# Patient Record
Sex: Male | Born: 1975 | Race: White | Hispanic: No | Marital: Single | Smoking: Never smoker
Health system: Southern US, Community
[De-identification: ages and names within clinical notes are randomized; demographics above are authoritative.]

## PROBLEM LIST (undated history)

## (undated) DIAGNOSIS — Z8719 Personal history of other diseases of the digestive system: Secondary | ICD-10-CM

## (undated) DIAGNOSIS — F329 Major depressive disorder, single episode, unspecified: Secondary | ICD-10-CM

## (undated) DIAGNOSIS — F419 Anxiety disorder, unspecified: Secondary | ICD-10-CM

## (undated) DIAGNOSIS — Z9889 Other specified postprocedural states: Secondary | ICD-10-CM

## (undated) DIAGNOSIS — F32A Depression, unspecified: Secondary | ICD-10-CM

## (undated) HISTORY — PX: PARATHYROIDECTOMY: SHX19

## (undated) HISTORY — PX: HERNIA REPAIR: SHX51

---

## 2007-10-21 ENCOUNTER — Emergency Department (HOSPITAL_COMMUNITY): Admission: EM | Admit: 2007-10-21 | Discharge: 2007-10-21 | Payer: Self-pay | Admitting: Emergency Medicine

## 2007-12-11 ENCOUNTER — Encounter: Admission: RE | Admit: 2007-12-11 | Discharge: 2007-12-11 | Payer: Self-pay | Admitting: Internal Medicine

## 2008-02-11 ENCOUNTER — Ambulatory Visit (HOSPITAL_COMMUNITY): Admission: RE | Admit: 2008-02-11 | Discharge: 2008-02-11 | Payer: Self-pay | Admitting: General Surgery

## 2008-02-11 ENCOUNTER — Encounter (INDEPENDENT_AMBULATORY_CARE_PROVIDER_SITE_OTHER): Payer: Self-pay | Admitting: General Surgery

## 2009-07-08 ENCOUNTER — Emergency Department (HOSPITAL_COMMUNITY): Admission: EM | Admit: 2009-07-08 | Discharge: 2009-07-09 | Payer: Self-pay | Admitting: Emergency Medicine

## 2009-09-16 ENCOUNTER — Emergency Department (HOSPITAL_COMMUNITY): Admission: EM | Admit: 2009-09-16 | Discharge: 2009-09-16 | Payer: Self-pay | Admitting: Emergency Medicine

## 2010-08-08 LAB — URINALYSIS, ROUTINE W REFLEX MICROSCOPIC
Ketones, ur: NEGATIVE mg/dL
Leukocytes, UA: NEGATIVE
Protein, ur: NEGATIVE mg/dL

## 2010-08-08 LAB — POCT I-STAT, CHEM 8
BUN: 18 mg/dL (ref 6–23)
Calcium, Ion: 1.13 mmol/L (ref 1.12–1.32)
Chloride: 108 mEq/L (ref 96–112)
Creatinine, Ser: 0.8 mg/dL (ref 0.4–1.5)
Glucose, Bld: 94 mg/dL (ref 70–99)
HCT: 45 % (ref 39.0–52.0)
Hemoglobin: 15.3 g/dL (ref 13.0–17.0)
Potassium: 3.8 mEq/L (ref 3.5–5.1)
Sodium: 142 meq/L (ref 135–145)
TCO2: 25 mmol/L (ref 0–100)

## 2010-08-08 LAB — URINE MICROSCOPIC-ADD ON

## 2010-08-08 LAB — CALCIUM: Calcium: 9.5 mg/dL (ref 8.4–10.5)

## 2010-10-03 NOTE — Op Note (Signed)
NAME:  Dennis Bush, Dennis Bush NO.:  [id number]   MEDICAL RECORD NO.:  [id number]          PATIENT TYPE:  AMB   LOCATION:  DAY                          FACILITY:  [facility]   PHYSICIAN:  Lennie Muckle, MD      DATE OF BIRTH:  [birth year]   DATE OF PROCEDURE:  02/11/2008  DATE OF DISCHARGE:                               OPERATIVE REPORT   PREOPERATIVE DIAGNOSES:  Right parathyroid adenoma.   POSTOPERATIVE DIAGNOSES:  Right parathyroid adenoma.   PROCEDURE:  Right parathyroid removal.   SURGEON:  Lennie Muckle, M.D.   ASSISTANT:  Leonie Man, M.D.   ANESTHESIA:  General endotracheal anesthesia.   FINDINGS:  A 1 g parathyroid adenoma or parathyroid gland.   COMPLICATIONS:  No immediate complications.   DRAINS:  No drains were placed.   INDICATIONS FOR PROCEDURE:  Dennis Bush is a 35 year old male who had  been having years of joint and body aches, headaches, etc.  During a  workup for headache, calcium level elevated to 11.1, PTH was elevated at  95.  Sestamibi scan revealed localization to the right inferior lobe of  the thyroid.  His examination and findings seemed consistent with a  right parathyroid adenoma.  I discussed with him preoperatively  performing a resection of the adenoma.  Risks of the surgery were  discussed with the patient prior to the procedure.  Informed consent was  obtained.   DETAILS OF PROCEDURE:  Dennis Bush was identified in the preoperative  holding area.  I had marked the right side preoperatively.  He had  received a gram of Kefzol and was taken to the operating room.  Once in  the operating room, he was placed in a supine position.  After  administration of general endotracheal anesthesia, he was placed in a  chair position.  His anterior neck was prepped and draped in the usual  sterile fashion.  Procedure time-out and identification of patient and  procedure were performed.  Using anatomic landmarks of the sternal notch  and  palpation of the cricothyroid, I measured an incision two  fingerbreadths above the sternal notch.  I placed a 3 cm incision.  Subcutaneous tissues were divided with electrocautery.  The platysma and  muscle was divided with electrocautery.  Superior and inferior straps  with electrocautery and blunt dissection.  The strap muscle on the right  was retracted laterally and, using electrocautery, dissected the right  plane.  The thyroid gland was identified.  Finger palpation revealed a  somewhat nodular goiter on the right.  Using careful dissection, we were  able to visualize a lesion just near the right inferior thyroid gland.  This did look suspicious for the adenoma.  Carefully dissecting down to  the adenoma, we placed clips on the small feeding vessels.  I did clip  and ligate the inferior thyroid vessel.  After fully removing the  parathyroid gland, this was passed to pathology for frozen section.  It  was identified as a 1 g parathyroid gland, consistent with adenoma.  No  suspicious cells were seen.   The wound  bed was irrigated.  Small vessel inferiorly was clipped.  Upon  final inspection, there was no evidence of bleeding.  I did place  Surgicel within the wound bed.  The strap muscles were reapproximated.  Platysma was closed with 3-0 Vicryl in interrupted fashion.  Skin was  closed with 4-0 Monocryl.  Steri-Strips were placed, followed by a  dressing.  I then used 0.25% Marcaine with epinephrine to anesthetize  the skin.  Patient was awakened, extubated, and transported to the  postanesthesia care unit in stable condition.   Due to his preoperative calcium being, I will place him on two Tums  q.i.d. starting immediately postoperatively.  Have him check a calcium  level tomorrow, and then he will have follow up with me in approximately  two weeks' time.      Lennie Muckle, MD  Electronically Signed     ALA/MEDQ  D:  02/11/2008  T:  02/11/2008  Job:  [id number]   cc:    Harrel Lemon. Merla Riches, M.D.  Fax: [phone]

## 2011-02-15 LAB — URINALYSIS, ROUTINE W REFLEX MICROSCOPIC
Glucose, UA: NEGATIVE
Specific Gravity, Urine: 1.028
Urobilinogen, UA: 1
pH: 6.5

## 2011-02-15 LAB — URINE MICROSCOPIC-ADD ON

## 2011-02-19 LAB — BASIC METABOLIC PANEL
BUN: 15
CO2: 30
GFR calc Af Amer: >60
GFR calc non Af Amer: >60

## 2011-02-19 LAB — HEMOGLOBIN AND HEMATOCRIT, BLOOD
HCT: 44.7
Hemoglobin: 15.6

## 2012-03-05 IMAGING — CT CT ABD-PELV W/O CM
2 of 4 series · 17 of 46 positions shown, 19 images · non-contrast
Comparison: Prior examinations 10/21/2007.

CLINICAL DATA: Right flank pain and hematuria.

CT ABDOMEN AND PELVIS WITHOUT CONTRAST
TECHNIQUE: Multidetector CT imaging of the abdomen and pelvis was
performed following the standard protocol without intravenous
contrast.

[Series 2: stone <(id) >(id) · axial · 0.70mm/px · z∈[-511,-131]mm · 14 of 84 slices shown, 16 images]
[im 4/84  soft-tissue]
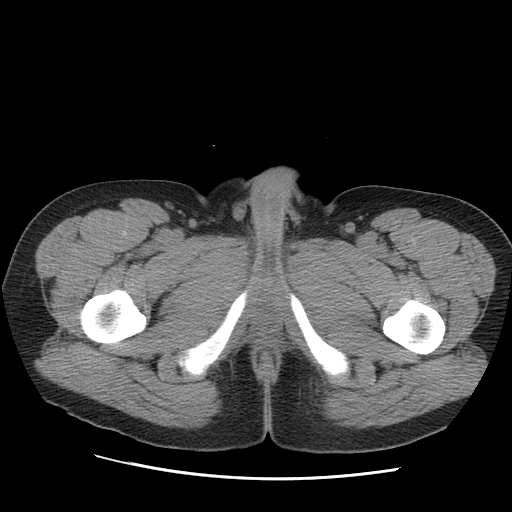
[im 4/84  bone]
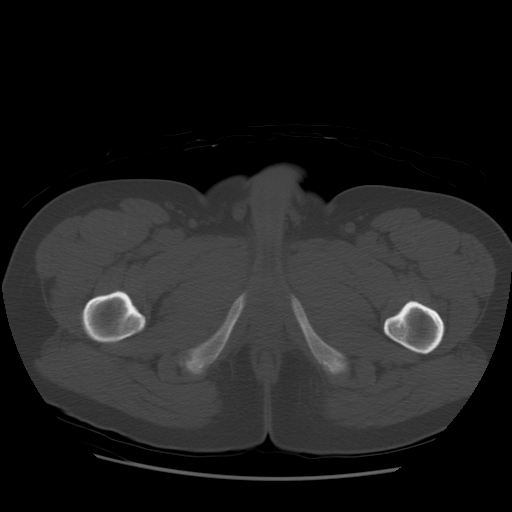
[im 11/84  soft-tissue]
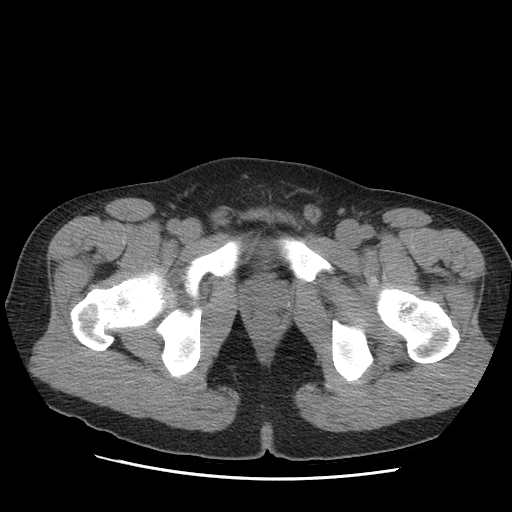
[im 18/84  soft-tissue]
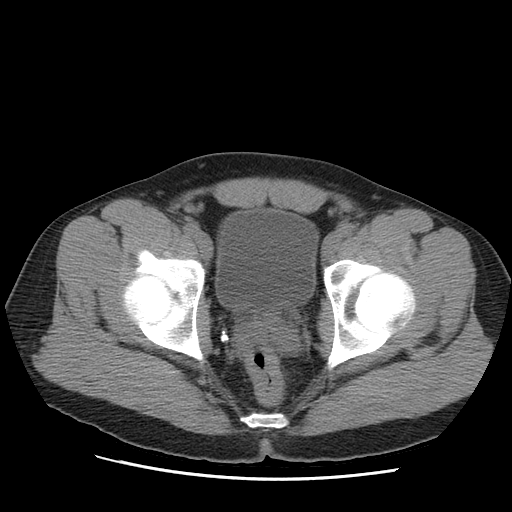
[im 21/84  soft-tissue]
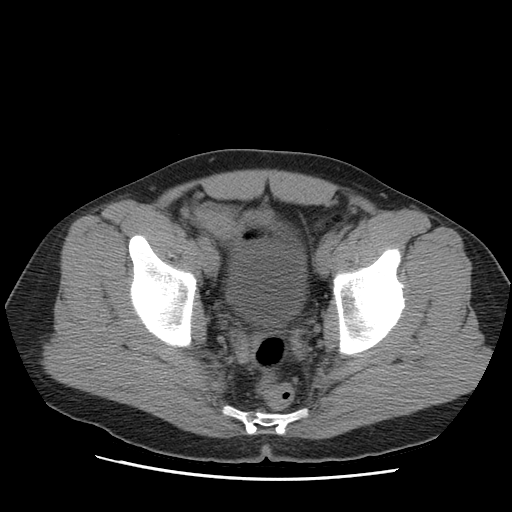
[im 28/84  soft-tissue]
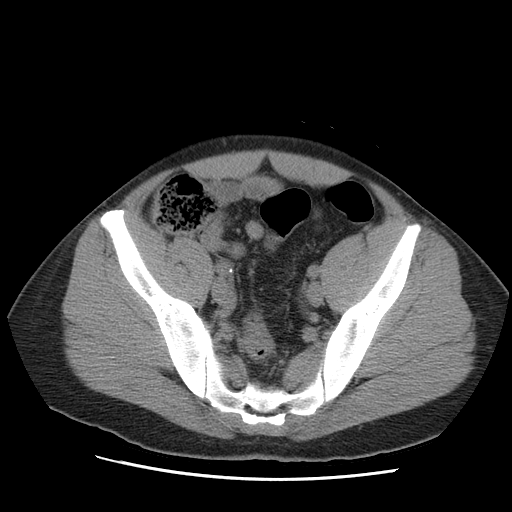
[im 35/84  soft-tissue]
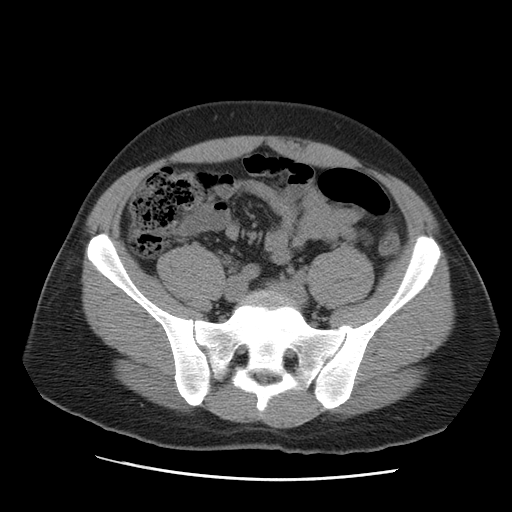
[im 39/84  soft-tissue]
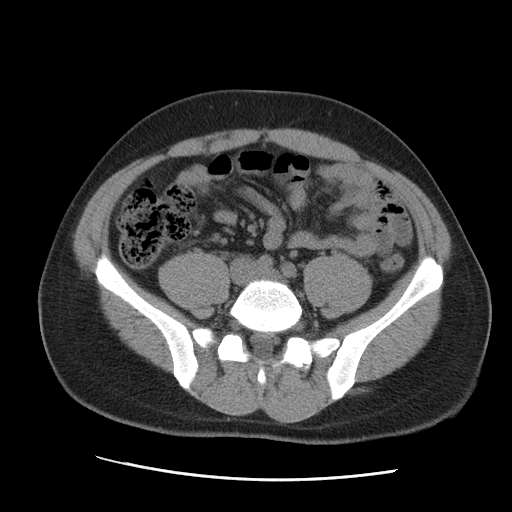
[im 45/84  soft-tissue]
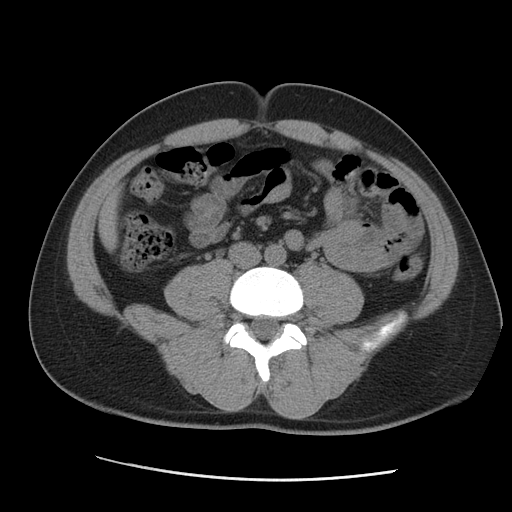
[im 49/84  soft-tissue]
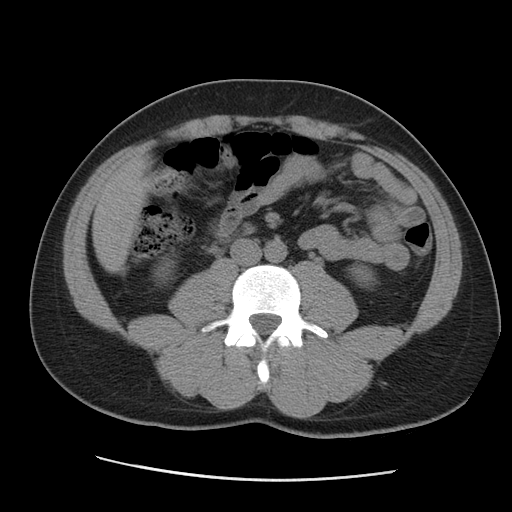
[im 49/84  bone]
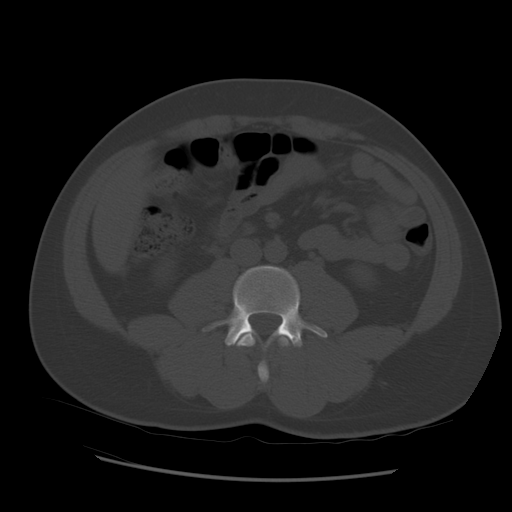
[im 56/84  soft-tissue]
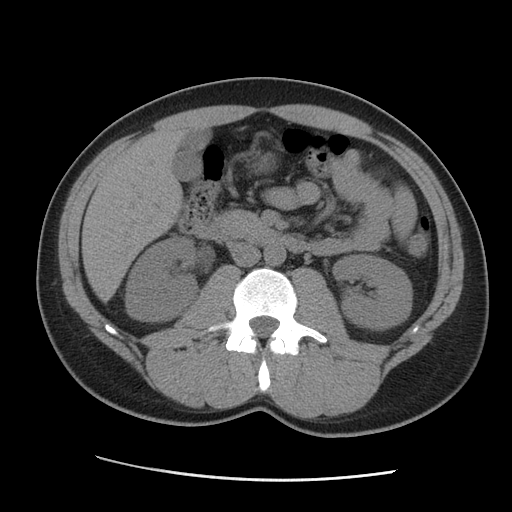
[im 63/84  soft-tissue]
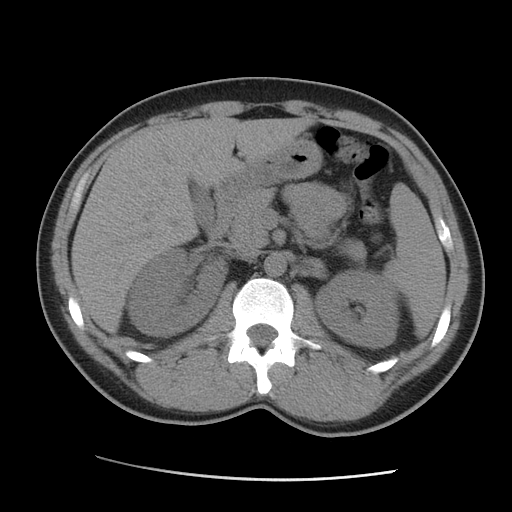
[im 66/84  soft-tissue]
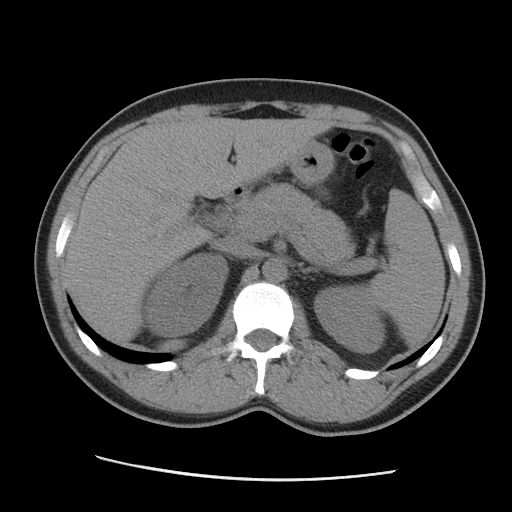
[im 73/84  soft-tissue]
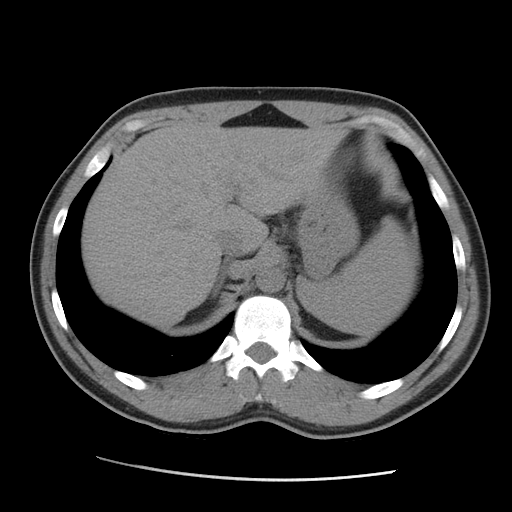
[im 80/84  soft-tissue]
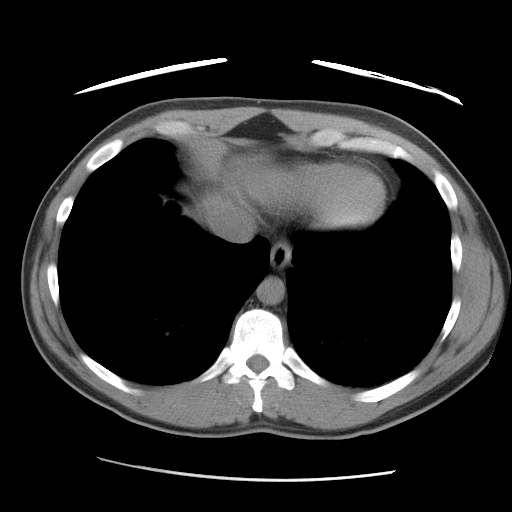

[Series 401: cor · coronal · 0.83mm/px · 3 of 81 slices shown]
[im 27/81  soft-tissue]
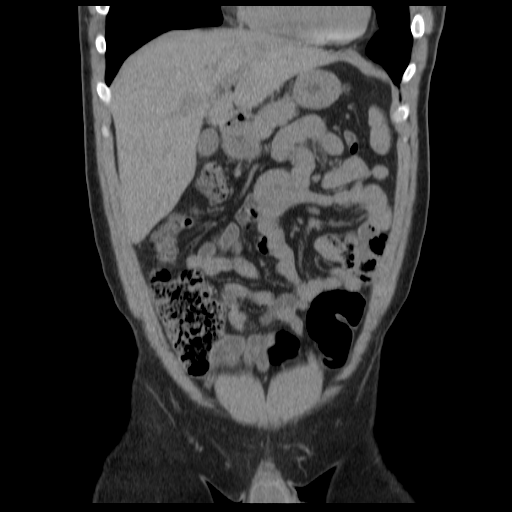
[im 36/81  soft-tissue]
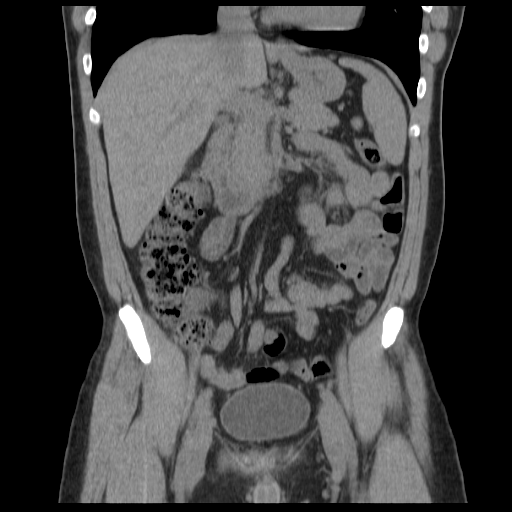
[im 45/81  soft-tissue]
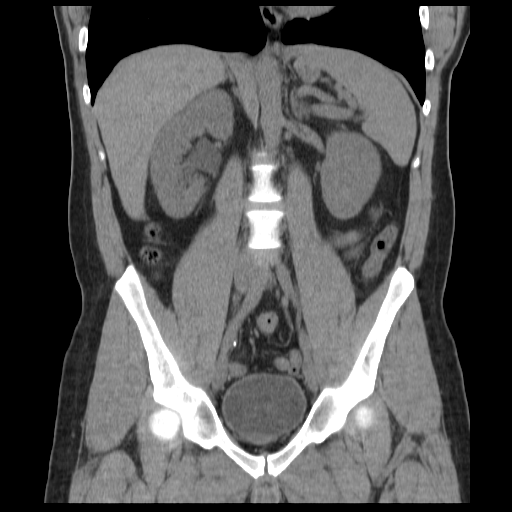

[17 of 46 positions shown; findings below may reference images not displayed]

FINDINGS: There is new mild right-sided hydronephrosis and
hydroureter associated with mild perinephric soft tissue stranding.
No renal calculi are demonstrated.  The right ureter is dilated
into the pelvis where there is a new 3 mm calcification on image
57, probably representing a ureteral calculus.  This is located 6
cm above the ureteral vesicle junction.  The distal right ureter
appears decompressed.  There are bilateral pelvic phleboliths.

The lung bases are clear. The remainder of the visualized abdomen
appears unremarkable as imaged in the noncontrast state, but as
such is suboptimally evaluated.
IMPRESSION: 1.  Obstructing 3 mm calculus in the right ureter approximately 6
cm above the ureteral vesicle junction.
2.  No renal calculi.
3.  No other acute abdominal findings.

## 2013-08-19 ENCOUNTER — Encounter (HOSPITAL_COMMUNITY): Payer: Self-pay | Admitting: Emergency Medicine

## 2013-08-19 ENCOUNTER — Emergency Department (HOSPITAL_COMMUNITY)
Admission: EM | Admit: 2013-08-19 | Discharge: 2013-08-19 | Disposition: A | Discharge status: Home / Self Care | Payer: Self-pay | Attending: Emergency Medicine | Admitting: Emergency Medicine

## 2013-08-19 DIAGNOSIS — R590 Localized enlarged lymph nodes: Secondary | ICD-10-CM

## 2013-08-19 DIAGNOSIS — Z79899 Other long term (current) drug therapy: Secondary | ICD-10-CM | POA: Insufficient documentation

## 2013-08-19 DIAGNOSIS — R599 Enlarged lymph nodes, unspecified: Secondary | ICD-10-CM | POA: Insufficient documentation

## 2013-08-19 DIAGNOSIS — F411 Generalized anxiety disorder: Secondary | ICD-10-CM | POA: Insufficient documentation

## 2013-08-19 DIAGNOSIS — F329 Major depressive disorder, single episode, unspecified: Secondary | ICD-10-CM | POA: Insufficient documentation

## 2013-08-19 DIAGNOSIS — Z9889 Other specified postprocedural states: Secondary | ICD-10-CM | POA: Insufficient documentation

## 2013-08-19 DIAGNOSIS — F3289 Other specified depressive episodes: Secondary | ICD-10-CM | POA: Insufficient documentation

## 2013-08-19 HISTORY — DX: Major depressive disorder, single episode, unspecified: F32.9

## 2013-08-19 HISTORY — DX: Depression, unspecified: F32.A

## 2013-08-19 HISTORY — DX: Other specified postprocedural states: Z98.890

## 2013-08-19 HISTORY — DX: Anxiety disorder, unspecified: F41.9

## 2013-08-19 HISTORY — DX: Personal history of other diseases of the digestive system: Z87.19

## 2013-08-19 LAB — RPR: RPR: NONREACTIVE

## 2013-08-19 NOTE — Discharge Instructions (Signed)
Lymphadenopathy °Lymphadenopathy means "disease of the lymph glands." But the term is usually used to describe swollen or enlarged lymph glands, also called lymph nodes. These are the bean-shaped organs found in many locations including the neck, underarm, and groin. Lymph glands are part of the immune system, which fights infections in your body. Lymphadenopathy can occur in just one area of the body, such as the neck, or it can be generalized, with lymph node enlargement in several areas. The nodes found in the neck are the most common sites of lymphadenopathy. °CAUSES  °When your immune system responds to germs (such as viruses or bacteria ), infection-fighting cells and fluid build up. This causes the glands to grow in size. This is usually not something to worry about. Sometimes, the glands themselves can become infected and inflamed. This is called lymphadenitis. °Enlarged lymph nodes can be caused by many diseases: °· Bacterial disease, such as strep throat or a skin infection. °· Viral disease, such as a common cold. °· Other germs, such as lyme disease, tuberculosis, or sexually transmitted diseases. °· Cancers, such as lymphoma (cancer of the lymphatic system) or leukemia (cancer of the white blood cells). °· Inflammatory diseases such as lupus or rheumatoid arthritis. °· Reactions to medications. °Many of the diseases above are rare, but important. This is why you should see your caregiver if you have lymphadenopathy. °SYMPTOMS  °· Swollen, enlarged lumps in the neck, back of the head or other locations. °· Tenderness. °· Warmth or redness of the skin over the lymph nodes. °· Fever. °DIAGNOSIS  °Enlarged lymph nodes are often near the source of infection. They can help healthcare providers diagnose your illness. For instance:  °· Swollen lymph nodes around the jaw might be caused by an infection in the mouth. °· Enlarged glands in the neck often signal a throat infection. °· Lymph nodes that are swollen  in more than one area often indicate an illness caused by a virus. °Your caregiver most likely will know what is causing your lymphadenopathy after listening to your history and examining you. Blood tests, x-rays or other tests may be needed. If the cause of the enlarged lymph node cannot be found, and it does not go away by itself, then a biopsy may be needed. Your caregiver will discuss this with you. °TREATMENT  °Treatment for your enlarged lymph nodes will depend on the cause. Many times the nodes will shrink to normal size by themselves, with no treatment. Antibiotics or other medicines may be needed for infection. Only take over-the-counter or prescription medicines for pain, discomfort or fever as directed by your caregiver. °HOME CARE INSTRUCTIONS  °Swollen lymph glands usually return to normal when the underlying medical condition goes away. If they persist, contact your health-care provider. He/she might prescribe antibiotics or other treatments, depending on the diagnosis. Take any medications exactly as prescribed. Keep any follow-up appointments made to check on the condition of your enlarged nodes.  °SEEK MEDICAL CARE IF:  °· Swelling lasts for more than two weeks. °· You have symptoms such as weight loss, night sweats, fatigue or fever that does not go away. °· The lymph nodes are hard, seem fixed to the skin or are growing rapidly. °· Skin over the lymph nodes is red and inflamed. This could mean there is an infection. °SEEK IMMEDIATE MEDICAL CARE IF:  °· Fluid starts leaking from the area of the enlarged lymph node. °· You develop a fever of 102° F (38.9° C) or greater. °· Severe   pain develops (not necessarily at the site of a large lymph node).  You develop chest pain or shortness of breath.  You develop worsening abdominal pain. MAKE SURE YOU:   Understand these instructions.  Will watch your condition.  Will get help right away if you are not doing well or get worse. Document  Released: 02/14/2008 Document Revised: 07/30/2011 Document Reviewed: 02/14/2008 [facility] Patient Information 2014 [place].   Emergency Department Resource Guide 1) Find a Doctor and Pay Out of Pocket Although you won't have to find out who is covered by your insurance plan, it is a good idea to ask around and get recommendations. You will then need to call the office and see if the doctor you have chosen will accept you as a new patient and what types of options they offer for patients who are self-pay. Some doctors offer discounts or will set up payment plans for their patients who do not have insurance, but you will need to ask so you aren't surprised when you get to your appointment.  2) Contact Your Local Health Department Not all health departments have doctors that can see patients for sick visits, but many do, so it is worth a call to see if yours does. If you don't know where your local health department is, you can check in your phone book. The CDC also has a tool to help you locate your state's health department, and many state websites also have listings of all of their local health departments.  3) Find a Walk-in Clinic If your illness is not likely to be very severe or complicated, you may want to try a walk in clinic. These are popping up all over the country in pharmacies, drugstores, and shopping centers. They're usually staffed by nurse practitioners or physician assistants that have been trained to treat common illnesses and complaints. They're usually fairly quick and inexpensive. However, if you have serious medical issues or chronic medical problems, these are probably not your best option.  No Primary Care Doctor: - Call Health Connect at  [phone] - they can help you locate a primary care doctor that  accepts your insurance, provides certain services, etc. - Physician Referral Service- [phone]  Chronic Pain Problems: Organization         Address  Phone    Notes  [facility]  [phone] Patients need to be referred by their primary care doctor.   Medication Assistance: Organization         Address  Phone   Notes  [facility]ication [facility] [address] [place], [place] [address] [phone] --Must be a resident of [facility] [place] -- Must have NO insurance coverage whatsoever (no Medicaid/ Medicare, etc.) -- The pt. MUST have a primary care doctor that directs their care regularly and follows them in the community   MedAssist  [phone]   [employer]  [phone]    Agencies that provide inexpensive medical care: Organization         Address  Phone   Notes  [facility]  [phone]   [facility]    [phone]   [facility] [address] [place], [place] [address] [phone]   [facility] [address], [place] [phone]   [facility]    [phone]   [facility]    [phone]) [phone]   [facility]  [address], [place] Phone:  [phone], Fax:  [phone] Hours of Operation:  9 am - 6 pm, M-F.  Also accepts Medicaid/Medicare and self-pay.  [facility]  [address], [place] Phone: [phone], Fax: [phone]. Hours of Operation:  8:30 am - 5:30 pm, M-F.  Also accepts Medicaid and self-pay.  [facility] [place] [address], [place] Phone: [phone]   [facility] [address] Natasha Bence [place], [place] [phone], Ext. [phone] Mondays & Thursdays: 7-9 AM.  First 15 patients are seen on a first come, first serve basis.    Medicaid-accepting [place] Providers:  Organization         Address  Phone   Notes  [facility] [address],  [phone] Also accepts self-pay patients.  [facility]  [address] Laurell Josephs [place], [place]  [phone]   [facility] [address], [place] [phone]   [facility] [address] [place] [phone]   Renaye Rakers [address], [place]   [phone] Only accepts [place] Access [place] patients after they have their name applied to their card.   Self-Pay (no insurance) in [facility]:  Organization         Address  Phone   Notes  Sickle Cell Patients, [facility] [address] [place], [place] [phone]   [facility] [address] [place], [place] [phone]   [facility]  [address], [place] [phone]   [facility]/Dr. Osei-Bonsu  [address] [place] or [address], [place] [phone] Phone number for both [place] and [place] locations is the same.  [facility] [address] [place] [phone]   [facility] [address] [place] or [address] [phone]   [facility] [address] [place] [phone], phone; [phone], fax Sees patients 1st and 3rd Saturday of every month.  Must not qualify for public or private insurance (i.e. Medicaid, Medicare, [place] Health Choice, Veterans' Benefits)  Household income should be no more than 200% of the poverty level The clinic cannot treat you if you are pregnant or think you are pregnant  Sexually transmitted diseases are not treated at the clinic.    Dental Care: Organization         Address  Phone  Notes  [facility] [address] [place], [place] [phone] Accepts children up to age 65 who are enrolled in [place] or [place] Health Choice; pregnant women with a Medicaid card; and children who have  applied for Medicaid or [place] Health Choice, but were declined, whose parents can pay a reduced fee at time of service.  [facility] Department of [facility]  [address], [place] [phone] Accepts children up to age 1 who are enrolled in [place] or [place] Health Choice; pregnant women with a Medicaid card; and children who have applied for Medicaid or [place] Health Choice, but were declined, whose parents can pay a reduced fee at time of service.  [facility]  [address] [place],  [place] [phone] Patients are seen by appointment only. Walk-ins are not accepted. [facility] will see patients 42 years of age and older. Monday - Tuesday (8am-5pm) Most Wednesdays (8:30-5pm) $30 per visit, cash only  [facility]  [address], [facility] [phone] Patients are seen by appointment only. Walk-ins are not accepted. [facility] will see patients 29 years of age and older. One Wednesday Evening (Monthly: Volunteer Based).  $30 per visit, cash only  [employer]  [phone] for adults; Children under age 97, call Graduate Pediatric Dentistry at [phone]. Children aged 30-14, please call [phone] to request a pediatric application.  Dental services are provided in all areas of dental care including fillings, crowns and bridges, complete and partial dentures, implants, gum treatment, root canals, and extractions. Preventive care is also provided. Treatment is provided to both adults and children. Patients are selected via a lottery and there is often a waiting list.   [facility] [address], [place]  [phone] [email or web address]   [facility] [address] [place], [place] [phone], Ext. [phone] Second and Fourth Thursday of each month, opens at 6:30 AM; Clinic ends at 9 AM.  Patients are seen on a first-come first-served basis, and a  limited number are seen during each clinic.   [facility]  [address] Ether Griffins [place], [place] [phone]   Eligibility Requirements You must have lived in [place], [place], or [place] counties for at least the last three months.   You cannot be eligible for state or federal sponsored [place], including [employer], [place], or [employer].   You generally cannot be eligible for healthcare insurance through your employer.    How to apply: Eligibility screenings are held every Tuesday and Wednesday afternoon from 1:00 pm until 4:00 pm. You do not need an appointment for the interview!  [facility] [address], [place], [place] [phone]   [facility] Health Department  [phone]   [facility] Health Department  [phone]   [facility] Health Department  [phone]    Behavioral Health Resources in the Community: Intensive Outpatient Programs Organization         Address  Phone  Notes  [facility] [address], [place], [place] [phone]   [facility] Out[facility] [address] [place], [place] [phone]   [facility] [address] [place], [place]  [phone]   [facility] [address]  [place], [place] [phone] or [phone]   Substance Abuse Resources Organization         Address  Phone  Notes  [facility]  [phone]   [facility]  [phone]   [facility]  [phone]   Floydene Flock  [phone]   Residential & Outpatient Substance Abuse Program  [phone]   Psychological Services Organization         Address  Phone  Notes  [facility]  [phone]   [facility]  [phone]   [facility] [address] [place] [phone] or [phone]    Mobile Crisis Teams Organization          Address  Phone  Notes  [facility], Mobile Crisis Care Unit  [phone]   [facility]  [address] [place], [place] [phone]   Doristine Locks [phone]  [address] [place] [phone]    Self-Help/Support Groups Organization         Address  Phone             Notes  [facility] of [place] - variety of support groups  336- I7437963 Call for more information  Narcotics Anonymous (NA), [facility] [address], [employer] [place]  2 meetings at this location   Statistician         Address  Phone  Notes  [facility] [address],    [place]  [phone]   [facility]  [address] [place] [address], [place], [place] [phone]   [facility] [address] [place], [place] [phone] Admissions: 8am-3pm M-F  [facility] [address]    [place], [place] [phone]   [facility] [address] [place], [place], [place] [phone]   [facility] [address],  [place], [place] [phone]   [facility] - Intensive Outpatient [address] Laurell Joseph[address], [place], [place] [phone]   [facility] ([facility] [address] [place]  [place], [place] [phone] or [phone]   [facility] ([facility]) [address], [place], [place] [phone] Accepts Medicaid  [facility] [address],  [place] [phone] Substance Abuse/Addiction Treatment   [facility] Organization         Address  Phone  Notes  [facility]  [phone]   Angie Fava, PhD [address] Ervin Knack [place], [place]   [phone] or [phone]   [facility]   [address] [place], [place] [phone]   [facility] [address], [place], [place] [phone] Insurance/Medicaid/sponsorship  through [facility] [address]                                    [place], [place] [phone] Therapy/tele-psych/case  [facility] [address], [place] [phone]    Dr. Lolly Mustache  [phone]   [facility]  [employer] [facility] Dept. 1) [address], [place] 2) [address], [place] 3)  [address], [place] [phone]  [phone]   [facility] Child Abuse Hotline 701-874-5018 or [phone] (After Hours)

## 2013-08-19 NOTE — ED Notes (Signed)
Pt alert x4 respirations easy non labored.  

## 2013-08-19 NOTE — ED Provider Notes (Signed)
CSN: [id number]     Arrival date & time 08/19/13  1316 History   First MD Initiated Contact with Patient 08/19/13 1524     Chief Complaint  Patient presents with  . Groin Pain  . Testicle Pain     (Consider location/radiation/quality/duration/timing/severity/associated sxs/prior Treatment) HPI Comments: 38 yo male with swelling in his groin for past few days.  Became painful while at work today.  No fevers.  No abd pain.    Patient is a 38 y.o. male presenting with groin pain.  Groin Pain This is a new problem. Episode onset: a few days ago, worse today. The problem occurs constantly. The problem has been gradually worsening. Pertinent negatives include no abdominal pain. 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 The symptoms are relieved by rest. He has tried nothing for the symptoms.    Past Medical History  Diagnosis Date  . Depression   . Anxiety   . H/O hernia repair    Past Surgical History  Procedure Laterality Date  . Hernia repair    . Parathyroidectomy     No family history on file. History  Substance Use Topics  . Smoking status: Never Smoker   . Smokeless tobacco: Not on file  . Alcohol Use: No    Review of Systems  Constitutional: Negative for fever.  Gastrointestinal: Negative for nausea, vomiting, abdominal pain and diarrhea.  Genitourinary: Negative for discharge, penile swelling, penile pain and testicular pain.  All other systems reviewed and are negative.      Allergies  Review of patient's allergies indicates no known allergies.  Home Medications   Current Outpatient Rx  Name  Route  Sig  Dispense  Refill  . ALPRAZolam (XANAX) 1 MG tablet   Oral   Take 1 mg by mouth 3 (three) times daily as needed for anxiety.         . Multiple Vitamins-Minerals (MULTIVITAMIN WITH MINERALS) tablet   Oral   Take 1 tablet by mouth daily.         . traMADol (ULTRAM) 50 MG tablet   Oral   Take 50 mg by mouth every 6 (six)  hours as needed for moderate pain.          BP 137/99  Pulse 88  Temp(Src) 97.7 F (36.5 C)  Resp 20  Ht 5\' 6"  (1.676 m)  Wt 135 lb (61.236 kg)  BMI 21.80 kg/m2  SpO2 96% Physical Exam  Nursing note and vitals reviewed. Constitutional: He is oriented to person, place, and time. He appears well-developed and well-nourished. No distress.  HENT:  Head: Normocephalic and atraumatic.  Eyes: Conjunctivae are normal. No scleral icterus.  Neck: Neck supple.  Cardiovascular: Normal rate and intact distal pulses.   Pulmonary/Chest: Effort normal. No stridor. No respiratory distress.  Abdominal: Normal appearance. He exhibits no distension.  Genitourinary:     Neurological: He is alert and oriented to person, place, and time.  Skin: Skin is warm and dry. No rash noted.  Psychiatric: He has a normal mood and affect. His behavior is normal.    ED Course  Procedures (including critical care time) Labs Review Labs Reviewed - No data to display Imaging Review No results found.   EKG Interpretation None      MDM   Final diagnoses:  Lymphadenopathy, inguinal    38 yo male with what appears to be a small tender lymph node in his right groin.  Does not feel like a  hernia.  No testicular pain or swelling.  No penile discharge or sores.  He desires STD testing, but denies symptoms.  GC/Chlamydia, RPR, and HIV drawn.    Candyce Churn III, MD 08/19/13 [phone]

## 2013-08-19 NOTE — ED Notes (Signed)
Pt states a couple days ago he noticed a lump to his R groin area with some pain radiating to his R testicle.  Pt rates pain 5/10.  Pt with hx of hernia and surgical repair.

## 2013-08-20 LAB — HIV ANTIBODY (ROUTINE TESTING W REFLEX): HIV: NONREACTIVE

## 2013-08-20 LAB — GC/CHLAMYDIA PROBE AMP
CT PROBE, AMP APTIMA: NEGATIVE
GC PROBE AMP APTIMA: NEGATIVE

## 2015-12-07 ENCOUNTER — Encounter (HOSPITAL_COMMUNITY): Payer: Self-pay

## 2015-12-07 ENCOUNTER — Emergency Department (HOSPITAL_COMMUNITY)
Admission: EM | Admit: 2015-12-07 | Discharge: 2015-12-07 | Disposition: A | Discharge status: Home / Self Care | Payer: BLUE CROSS/BLUE SHIELD | Attending: Emergency Medicine | Admitting: Emergency Medicine

## 2015-12-07 DIAGNOSIS — W290XXA Contact with powered kitchen appliance, initial encounter: Secondary | ICD-10-CM | POA: Diagnosis not present

## 2015-12-07 DIAGNOSIS — S6992XA Unspecified injury of left wrist, hand and finger(s), initial encounter: Secondary | ICD-10-CM | POA: Diagnosis present

## 2015-12-07 DIAGNOSIS — Y990 Civilian activity done for income or pay: Secondary | ICD-10-CM | POA: Diagnosis not present

## 2015-12-07 DIAGNOSIS — Y9289 Other specified places as the place of occurrence of the external cause: Secondary | ICD-10-CM | POA: Insufficient documentation

## 2015-12-07 DIAGNOSIS — Y939 Activity, unspecified: Secondary | ICD-10-CM | POA: Diagnosis not present

## 2015-12-07 DIAGNOSIS — S61217A Laceration without foreign body of left little finger without damage to nail, initial encounter: Secondary | ICD-10-CM | POA: Diagnosis not present

## 2015-12-07 MED ORDER — TETANUS-DIPHTH-ACELL PERTUSSIS 5-2.5-18.5 LF-MCG/0.5 IM SUSP
0.5000 mL | Freq: Once | INTRAMUSCULAR | Status: AC
  Administered 2015-12-07: 0.5 mL via INTRAMUSCULAR
  Filled 2015-12-07: qty 0.5

## 2015-12-07 MED ORDER — CEPHALEXIN 500 MG PO CAPS: 500.0000 mg | ORAL_CAPSULE | Freq: Four times a day (QID) | ORAL | Status: AC

## 2015-12-07 NOTE — ED Notes (Signed)
Declined W/C at D/C and was escorted to lobby by RN. 

## 2015-12-07 NOTE — ED Notes (Addendum)
Patient here with left hand 5th finger laceration after cutting same on slicer, bleeding controlled with pressure. Nailbed intact.

## 2015-12-07 NOTE — Discharge Instructions (Signed)
Take the prescribed medication as directed.  Keep wound clean with soap and warm water.  Make sure to keep wound bandaged if working around food at work. Follow-up with your primary care doctor. Return to the ED for new or worsening symptoms.

## 2015-12-07 NOTE — ED Provider Notes (Signed)
CSN: [id number]     Arrival date & time 12/07/15  4098 History  By signing my name below, I, Dennis Bush, attest that this documentation has been prepared under the direction and in the presence of non-physician practitioner, Sharilyn Sites, PA-C. Electronically Signed: Freida Bush, Scribe. 12/07/2015. 9:32 AM.   Chief Complaint  Patient presents with  . Finger Injury   The history is provided by the patient. No language interpreter was used.    HPI Comments:  Dennis Bush is a 40 y.o. male who presents to the Emergency Department complaining of a laceration to the left 5th finger which he sustained this AM while using a meat slicer. Pt is right hand dominant. Bleeding controlled PTA. Pt has no other complaints or symptoms at this time. Tetanus status is unknown.    Past Medical History  Diagnosis Date  . Depression   . Anxiety   . H/O hernia repair    Past Surgical History  Procedure Laterality Date  . Hernia repair    . Parathyroidectomy     No family history on file. Social History  Substance Use Topics  . Smoking status: Never Smoker   . Smokeless tobacco: None  . Alcohol Use: No    Review of Systems  Constitutional: Negative for fever and chills.  Respiratory: Negative for shortness of breath.   Cardiovascular: Negative for chest pain.  Skin: Positive for wound.  All other systems reviewed and are negative.  Allergies  Review of patient's allergies indicates no known allergies.  Home Medications   Prior to Admission medications   Medication Sig Start Date End Date Taking? Authorizing Provider  ALPRAZolam Prudy Feeler) 1 MG tablet Take 1 mg by mouth 3 (three) times daily as needed for anxiety.    Historical Provider, MD  Multiple Vitamins-Minerals (MULTIVITAMIN WITH MINERALS) tablet Take 1 tablet by mouth daily.    Historical Provider, MD  traMADol (ULTRAM) 50 MG tablet Take 50 mg by mouth every 6 (six) hours as needed for moderate pain.    Historical Provider, MD    BP 130/87 mmHg  Pulse 85  Temp(Src) 98.2 F (36.8 C) (Oral)  Resp 18  SpO2 100%   Physical Exam  Constitutional: He is oriented to person, place, and time. He appears well-developed and well-nourished.  HENT:  Head: Normocephalic and atraumatic.  Mouth/Throat: Oropharynx is clear and moist.  Eyes: Conjunctivae and EOM are normal. Pupils are equal, round, and reactive to light.  Neck: Normal range of motion.  Cardiovascular: Normal rate, regular rhythm and normal heart sounds.   Pulmonary/Chest: Effort normal and breath sounds normal.  Abdominal: Soft. Bowel sounds are normal.  Musculoskeletal: Normal range of motion.       Left hand: He exhibits laceration.       Hands: Avulsion type laceration to distal tip along the ulnar aspect of the left 5th digit; wound is hemostatic at this time; no nail or nailbed involvement; full flexion/extension of finger maintained; no evidence of deep tissue, vessel, or tendon involvement; normal cap refill; normal sensation  Neurological: He is alert and oriented to person, place, and time.  Skin: Skin is warm and dry.  Psychiatric: He has a normal mood and affect.  Nursing note and vitals reviewed.   ED Course  Procedures   DIAGNOSTIC STUDIES:  Oxygen Saturation is 100% on RA, normal by my interpretation.    COORDINATION OF CARE:  9:27 AM Will apply pressure dressing, update tetanus and discharge with antibiotic. Discussed treatment plan with  pt at bedside and pt agreed to plan.   MDM   Final diagnoses:  Laceration of left little finger w/o foreign body w/o damage to nail, initial encounter   40 year old male here with laceration to left fifth digit from a slicer at work. Patient has avulsion type laceration to the distal tip of his left fifth digit that cannot be repaired. Wound is hemostatic. He has full flexion and extension of finger and there is no evidence of deep tissue, vessel, or tendon involvement on exam. His finger is  neurovascularly intact. Tetanus will be updated. Given laceration occurred from a slicer that cuts raw meat and wound cannot be closed, will start on antibiotics.  discussed home wound care.  Discussed plan with patient, he acknowledged understanding and agreed with plan of care.  Return precautions given for new or worsening symptoms.  I personally performed the services described in this documentation, which was scribed in my presence. The recorded information has been reviewed and is accurate.  Garlon HatchetLisa M Yeraldin Litzenberger, PA-C 12/07/15 [id number]  Mancel BaleElliott Wentz, MD 12/08/15 [phone]
